# Patient Record
Sex: Male | Born: 1965 | Race: White | Hispanic: No | Marital: Married | Smoking: Never smoker
Health system: Southern US, Community
[De-identification: ages and names within clinical notes are randomized; demographics above are authoritative.]

## PROBLEM LIST (undated history)

## (undated) HISTORY — PX: OTHER SURGICAL HISTORY: SHX169

## (undated) HISTORY — PX: WISDOM TOOTH EXTRACTION: SHX21

---

## 2001-08-02 ENCOUNTER — Encounter: Payer: Self-pay | Admitting: *Deleted

## 2001-08-02 ENCOUNTER — Ambulatory Visit (HOSPITAL_COMMUNITY): Admission: EM | Admit: 2001-08-02 | Discharge: 2001-08-02 | Payer: Self-pay | Admitting: Emergency Medicine

## 2001-08-02 ENCOUNTER — Encounter: Payer: Self-pay | Admitting: Orthopedic Surgery

## 2009-11-11 ENCOUNTER — Emergency Department (HOSPITAL_COMMUNITY): Admission: EM | Admit: 2009-11-11 | Discharge: 2009-11-11 | Payer: Self-pay | Admitting: Emergency Medicine

## 2010-10-24 LAB — COMPREHENSIVE METABOLIC PANEL
ALT: 34 U/L (ref 0–53)
BUN: 17 mg/dL (ref 6–23)
CO2: 30 mEq/L (ref 19–32)
GFR calc Af Amer: >60 mL/min (ref >60)
Glucose, Bld: 111 mg/dL — ABNORMAL HIGH (ref 70–99)
Potassium: 3.6 mEq/L (ref 3.5–5.1)
Sodium: 136 mEq/L (ref 135–145)

## 2010-10-24 LAB — POCT I-STAT, CHEM 8
BUN: 21 mg/dL (ref 6–23)
Calcium, Ion: 1.13 mmol/L (ref 1.12–1.32)
Chloride: 105 mEq/L (ref 96–112)
Creatinine, Ser: 0.9 mg/dL (ref 0.4–1.5)
Glucose, Bld: 103 mg/dL — ABNORMAL HIGH (ref 70–99)
Hemoglobin: 15.6 g/dL (ref 13.0–17.0)
Sodium: 141 mEq/L (ref 135–145)

## 2010-10-24 LAB — CBC
Hemoglobin: 15.7 g/dL (ref 13.0–17.0)
MCHC: 34.4 g/dL (ref 30.0–36.0)
Platelets: 237 10*3/uL (ref 150–400)

## 2010-10-24 LAB — PROTIME-INR: INR: 0.98 (ref 0.00–1.49)
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
## 2016-06-29 ENCOUNTER — Emergency Department (HOSPITAL_BASED_OUTPATIENT_CLINIC_OR_DEPARTMENT_OTHER): Payer: BLUE CROSS/BLUE SHIELD

## 2016-06-29 ENCOUNTER — Encounter (HOSPITAL_BASED_OUTPATIENT_CLINIC_OR_DEPARTMENT_OTHER): Payer: Self-pay | Admitting: Adult Health

## 2016-06-29 ENCOUNTER — Emergency Department (HOSPITAL_BASED_OUTPATIENT_CLINIC_OR_DEPARTMENT_OTHER)
Admission: EM | Admit: 2016-06-29 | Discharge: 2016-06-29 | Disposition: A | Discharge status: Home / Self Care | Payer: BLUE CROSS/BLUE SHIELD | Attending: Emergency Medicine | Admitting: Emergency Medicine

## 2016-06-29 DIAGNOSIS — Y999 Unspecified external cause status: Secondary | ICD-10-CM | POA: Diagnosis not present

## 2016-06-29 DIAGNOSIS — Y92410 Unspecified street and highway as the place of occurrence of the external cause: Secondary | ICD-10-CM | POA: Insufficient documentation

## 2016-06-29 DIAGNOSIS — Y939 Activity, unspecified: Secondary | ICD-10-CM | POA: Diagnosis not present

## 2016-06-29 DIAGNOSIS — S40012A Contusion of left shoulder, initial encounter: Secondary | ICD-10-CM | POA: Diagnosis not present

## 2016-06-29 DIAGNOSIS — S4992XA Unspecified injury of left shoulder and upper arm, initial encounter: Secondary | ICD-10-CM | POA: Diagnosis present

## 2016-06-29 MED ORDER — HYDROCODONE-ACETAMINOPHEN 5-325 MG PO TABS: ORAL_TABLET | ORAL | 0 refills | Status: AC

## 2016-06-29 MED ORDER — OXYCODONE-ACETAMINOPHEN 5-325 MG PO TABS
1.0000 | ORAL_TABLET | ORAL | Status: DC | PRN
  Administered 2016-06-29: 1 via ORAL
  Filled 2016-06-29: qty 1
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
## 2016-06-29 NOTE — ED Notes (Signed)
Pt and wife given d/c instructions as per chart. Verbalizes understanding. No questions. Rx x 1 with precautions.
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
## 2016-06-29 NOTE — Discharge Instructions (Signed)
Please read and follow all provided instructions.  Your diagnoses today include:  1. Contusion of left shoulder, initial encounter     Tests performed today include:  An x-ray of the affected area - does NOT show any broken bones  Vital signs. See below for your results today.   Medications prescribed:   Vicodin (hydrocodone/acetaminophen) - narcotic pain medication  DO NOT drive or perform any activities that require you to be awake and alert because this medicine can make you drowsy. BE VERY CAREFUL not to take multiple medicines containing Tylenol (also called acetaminophen). Doing so can lead to an overdose which can damage your liver and cause liver failure and possibly death.  Take any prescribed medications only as directed.  Home care instructions:   Follow any educational materials contained in this packet  Follow R.I.C.E. Protocol:  R - rest your injury   I  - use ice on injury without applying directly to skin  C - compress injury with bandage or splint  E - elevate the injury as much as possible  Follow-up instructions: Please follow-up with your primary care provider or the provided orthopedic physician (bone specialist) if you continue to have significant pain in 1 week. In this case you may have a more severe injury that requires further care.   Return instructions:   Please return if your fingers are numb or tingling, appear gray or blue, or you have severe pain (also elevate the arm and loosen splint or wrap if you were given one)  Please return to the Emergency Department if you experience worsening symptoms.   Please return if you have any other emergent concerns.  Additional Information:  Your vital signs today were: BP (S) 128/85 (BP Location: Right Arm)    Pulse 94    Temp 97.7 F (36.5 C) (Oral)    Resp 18    Ht 6\' 3"  (1.905 m)    Wt 104.3 kg    SpO2 97%    BMI 28.75 kg/m  If your blood pressure (BP) was elevated above 135/85 this visit,  please have this repeated by your doctor within one month. --------------

## 2016-09-01 ENCOUNTER — Encounter (HOSPITAL_BASED_OUTPATIENT_CLINIC_OR_DEPARTMENT_OTHER): Payer: Self-pay | Admitting: *Deleted

## 2016-09-01 ENCOUNTER — Emergency Department (HOSPITAL_BASED_OUTPATIENT_CLINIC_OR_DEPARTMENT_OTHER)
Admission: EM | Admit: 2016-09-01 | Discharge: 2016-09-01 | Disposition: A | Discharge status: Home / Self Care | Payer: BLUE CROSS/BLUE SHIELD | Attending: Emergency Medicine | Admitting: Emergency Medicine

## 2016-09-01 DIAGNOSIS — L03112 Cellulitis of left axilla: Secondary | ICD-10-CM | POA: Diagnosis not present

## 2016-09-01 DIAGNOSIS — L02412 Cutaneous abscess of left axilla: Secondary | ICD-10-CM | POA: Insufficient documentation

## 2016-09-01 DIAGNOSIS — Z79899 Other long term (current) drug therapy: Secondary | ICD-10-CM | POA: Insufficient documentation

## 2016-09-01 DIAGNOSIS — L0291 Cutaneous abscess, unspecified: Secondary | ICD-10-CM

## 2016-09-01 MED ORDER — LIDOCAINE-EPINEPHRINE (PF) 2 %-1:200000 IJ SOLN
INTRAMUSCULAR | Status: AC
  Administered 2016-09-01: 10 mL
  Filled 2016-09-01: qty 20

## 2016-09-01 MED ORDER — ACETAMINOPHEN 500 MG PO TABS
1000.0000 mg | ORAL_TABLET | Freq: Once | ORAL | Status: AC
  Administered 2016-09-01: 1000 mg via ORAL
  Filled 2016-09-01: qty 2

## 2016-09-01 MED ORDER — LIDOCAINE-EPINEPHRINE (PF) 2 %-1:200000 IJ SOLN
10.0000 mL | Freq: Once | INTRAMUSCULAR | Status: AC
  Administered 2016-09-01: 10 mL

## 2016-09-01 MED ORDER — CLINDAMYCIN HCL 300 MG PO CAPS: 300.0000 mg | ORAL_CAPSULE | Freq: Three times a day (TID) | ORAL | 0 refills | Status: AC

## 2016-09-01 NOTE — Discharge Instructions (Signed)
Please take your antibiotics as prescribed. He may continue taking Tylenol and ibuprofen per label for pain. Keep your wound clean and dry, it is normal for it to drain over the next several days. Follow-up with your doctor next week for wound recheck. Return to ED for new or worsening symptoms as we discussed.

## 2016-09-01 NOTE — ED Triage Notes (Signed)
Left axillary abscess for the last five days.  Fever last night of unknown amount.  Spouse started bactrim two nights ago.

## 2016-09-01 NOTE — ED Provider Notes (Signed)
[facility]   CSN: [id number] Arrival date & time: 09/01/16  [id number]     History   Chief Complaint Chief Complaint  Patient presents with  . Abscess    Left axillary    HPI Peter Turner is a 51 y.o. male.  HPI here for evaluation of possible abscess under left armpit. Patient reports symptoms started Tuesday. He reports a small pimple that has gradually increased in size, tenderness and redness. Patient's wife called her NP and patient was started on Bactrim therapy. He has had 3 doses, starting on Friday. She also gave him a hydrocodone at home to help the pain. He reports subjective tactile fever at home. No chest pain, difficulties breathing, abdominal pain, numbness or weakness, rash.  History reviewed. No pertinent past medical history.  There are no active problems to display for this patient.   Past Surgical History:  Procedure Laterality Date  . arm surgery     fracture  . WISDOM TOOTH EXTRACTION         Home Medications    Prior to Admission medications   Medication Sig Start Date End Date Taking? Authorizing Provider  omeprazole (PRILOSEC) 20 MG capsule Take 20 mg by mouth daily.   Yes Historical Provider, MD  sulfamethoxazole-trimethoprim (BACTRIM DS,SEPTRA DS) 800-160 MG tablet Take 1 tablet by mouth 2 (two) times daily.   Yes Historical Provider, MD  clindamycin (CLEOCIN) 300 MG capsule Take 1 capsule (300 mg total) by mouth 3 (three) times daily. X 7 days 09/01/16 09/08/16  Joycie PeekBenjamin Blaize Epple, PA-C  HYDROcodone-acetaminophen (NORCO/VICODIN) 5-325 MG tablet Take 1-2 tablets every 6 hours as needed for severe pain 06/29/16   Renne CriglerJoshua Geiple, PA-C    Family History No family history on file.  Social History Social History  Substance Use Topics  . Smoking status: Never Smoker  . Smokeless tobacco: Never Used  . Alcohol use No     Allergies   Patient has no known allergies.   Review of Systems Review of Systems See history of  present illness  Physical Exam Updated Vital Signs BP 132/90 (BP Location: Left Arm)   Pulse 117   Temp 98.4 F (36.9 C) (Oral)   Resp 18   Ht 6\' 3"  (1.905 m)   Wt 102.1 kg   SpO2 100%   BMI 28.12 kg/m   Physical Exam  Constitutional: He appears well-developed. No distress.  Awake, alert and nontoxic in appearance  HENT:  Head: Normocephalic and atraumatic.  Right Ear: External ear normal.  Left Ear: External ear normal.  Mouth/Throat: Oropharynx is clear and moist.  Eyes: Conjunctivae and EOM are normal. Pupils are equal, round, and reactive to light.  Neck: Normal range of motion. No JVD present.  Cardiovascular: Normal rate, regular rhythm and normal heart sounds.   Regular rate and rhythm. Rate 90s. Normal heart sounds  Pulmonary/Chest: Effort normal and breath sounds normal. No stridor.  Abdominal: Soft. There is no tenderness.  Musculoskeletal: Normal range of motion.  Left axilla. Abscess roughly 1 cm in diameter with surrounding cellulitis and lymphadenopathy  Neurological:  Awake, alert, cooperative and aware of situation; motor strength bilaterally; sensation normal to light touch bilaterally; no facial asymmetry; tongue midline; major cranial nerves appear intact;  baseline gait without new ataxia.  Skin: No rash noted. He is not diaphoretic.  Psychiatric: He has a normal mood and affect. His behavior is normal. Thought content normal.  Nursing note and vitals reviewed.  EMERGENCY DEPARTMENT US SOFT  TISSUE INTERPRETATION "Study: Limited Ultrasound of the noted body part in comments below"  INDICATIONS: Soft tissue infection Multiple views of the body part are obtained with a multi-frequency linear probe  PERFORMED BY:  Myself  IMAGES ARCHIVED?: Yes  SIDE:Left  BODY PART:Axilla  FINDINGS: Abcess present and Cellulitis present  LIMITATIONS:  Body Habitus  INTERPRETATION:  Abcess present and Cellulitis present  COMMENT:  Left axillary abscess and  cellulitis. Confirmed no lymph node.  INCISION AND DRAINAGE Performed by: Sharlene Motts Consent: Verbal consent obtained. Risks and benefits: risks, benefits and alternatives were discussed Type: abscess  Body area: Left axilla  Anesthesia: local infiltration  Incision was made with a scalpel.  Local anesthetic: lidocaine 2 % with epinephrine  Anesthetic total: 3 ml  Complexity: complex Blunt dissection to break up loculations  Drainage: purulent  Drainage amount: Moderate   Packing material: None   Patient tolerance: Patient tolerated the procedure well with no immediate complications.       ED Treatments / Results  Labs (all labs ordered are listed, but only abnormal results are displayed) Labs Reviewed - No data to display  EKG  EKG Interpretation None       Radiology No results found.  Procedures Procedures (including critical care time)  Medications Ordered in ED Medications  lidocaine-EPINEPHrine (XYLOCAINE W/EPI) 2 %-1:200000 (PF) injection 10 mL (10 mLs Infiltration Given by Other 09/01/16 1220)  acetaminophen (TYLENOL) tablet 1,000 mg (1,000 mg Oral Given 09/01/16 1218)     Initial Impression / Assessment and Plan / ED Course  I have reviewed the triage vital signs and the nursing notes.  Pertinent labs & imaging results that were available during my care of the patient were reviewed by me and considered in my medical decision making (see chart for details).     Patient with skin abscess amenable to incision and drainage.  Abscess was not large enough to warrant packing or drain,  wound recheck in 2 days. Encouraged home warm soaks and flushing. Signs of cellulitis in surrounding skin.  Started on clindamycin. Tylenol Motrin for discomfort. Will d/c to home.    Final Clinical Impressions(s) / ED Diagnoses   Final diagnoses:  Abscess  Cellulitis of left axilla    New Prescriptions New Prescriptions   CLINDAMYCIN (CLEOCIN) 300 MG  CAPSULE    Take 1 capsule (300 mg total) by mouth 3 (three) times daily. X 7 days     Joycie Peek, PA-C 09/01/16 1311    Lyndal Pulley, MD 09/02/16 [phone]
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
